# Patient Record
Sex: Male | Born: 1970 | Race: Black or African American | Hispanic: No | Smoking: Never smoker
Health system: Southern US, Community
[De-identification: ages and names within clinical notes are randomized; demographics above are authoritative.]

## PROBLEM LIST (undated history)

## (undated) HISTORY — PX: HERNIA REPAIR: SHX51

---

## 2011-03-22 ENCOUNTER — Ambulatory Visit: Payer: Self-pay | Admitting: Internal Medicine

## 2013-03-24 IMAGING — CR DG FOOT COMPLETE 3+V*L*
1 series · 3 of 3 positions shown · non-contrast
Comparison: none

REASON FOR EXAM: L foot pain due to injury at work.  Foot got caught
between and [employer] and forklif
COMMENTS:

PROCEDURE:     MDR - MDR FOOT LT COMP W/OBLQUES  - March 22, 2011  [DATE]
RESULT:     No fracture, dislocation or other acute bony abnormality is
identified. No soft tissue foreign body is noted.

[Series 1: view not recorded · 0.17mm/px · 3 of 3 slices shown]
[im 1/3]
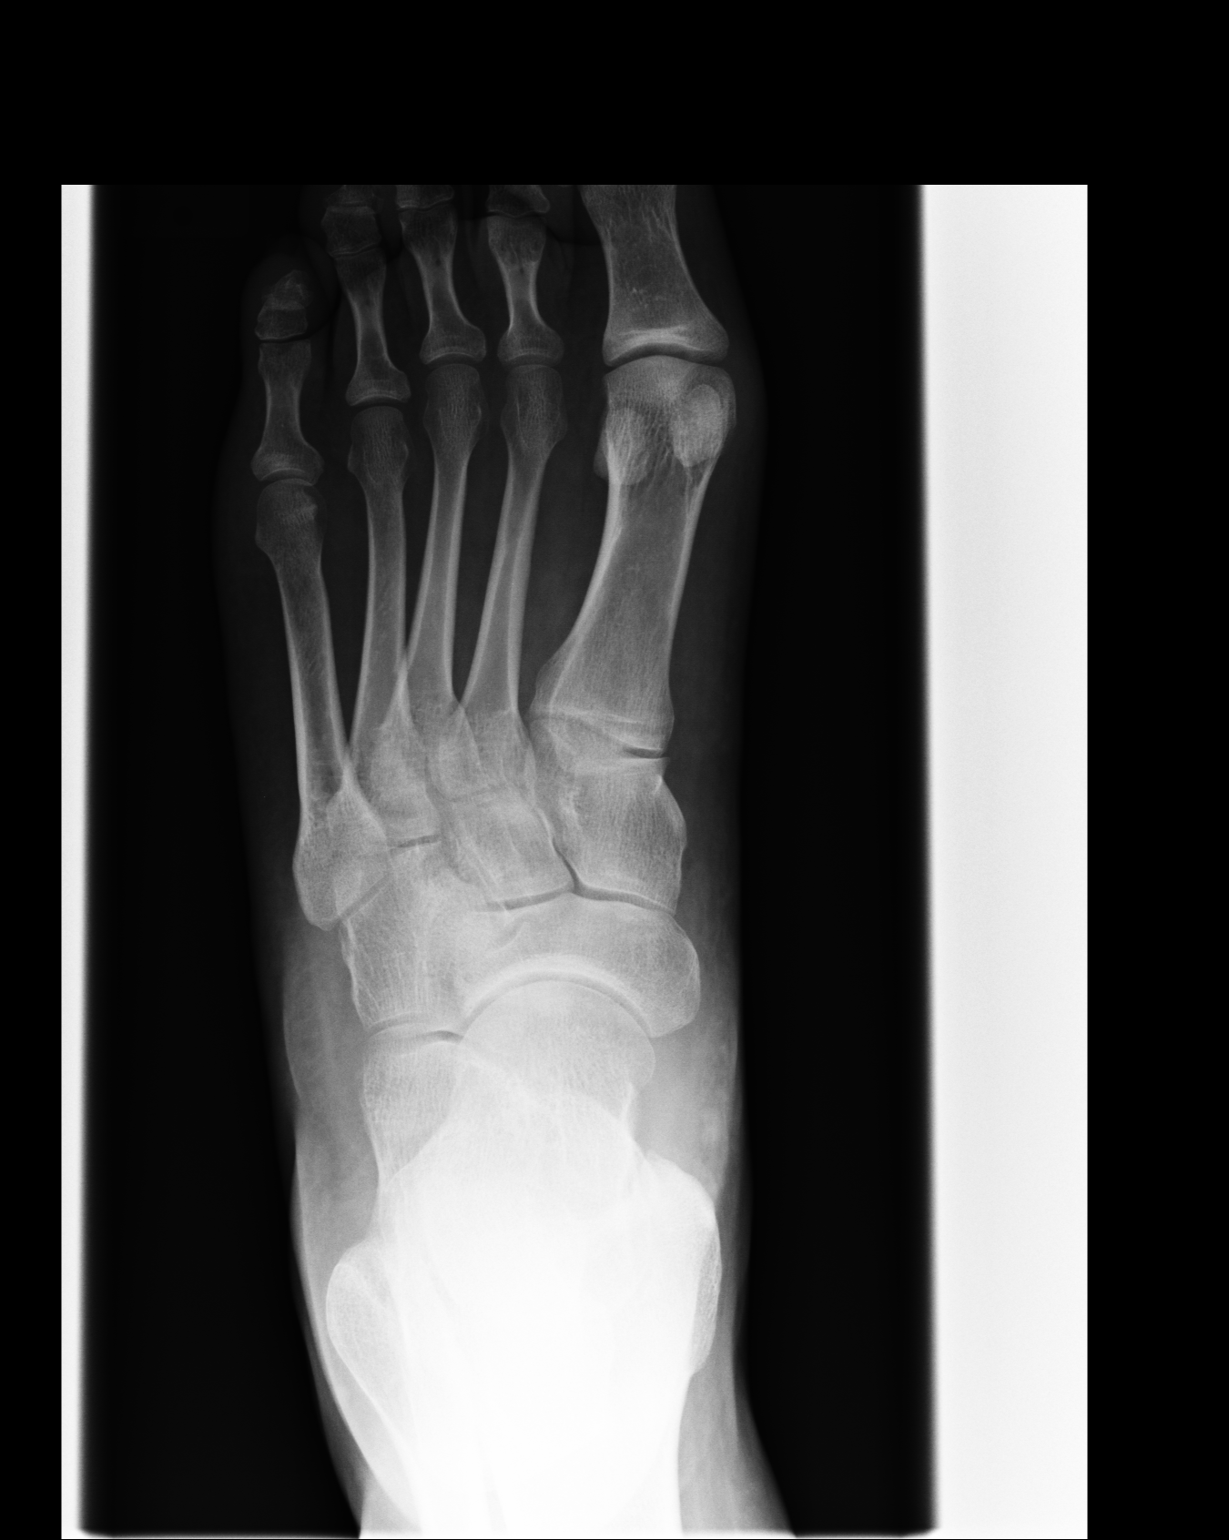
[im 2/3]
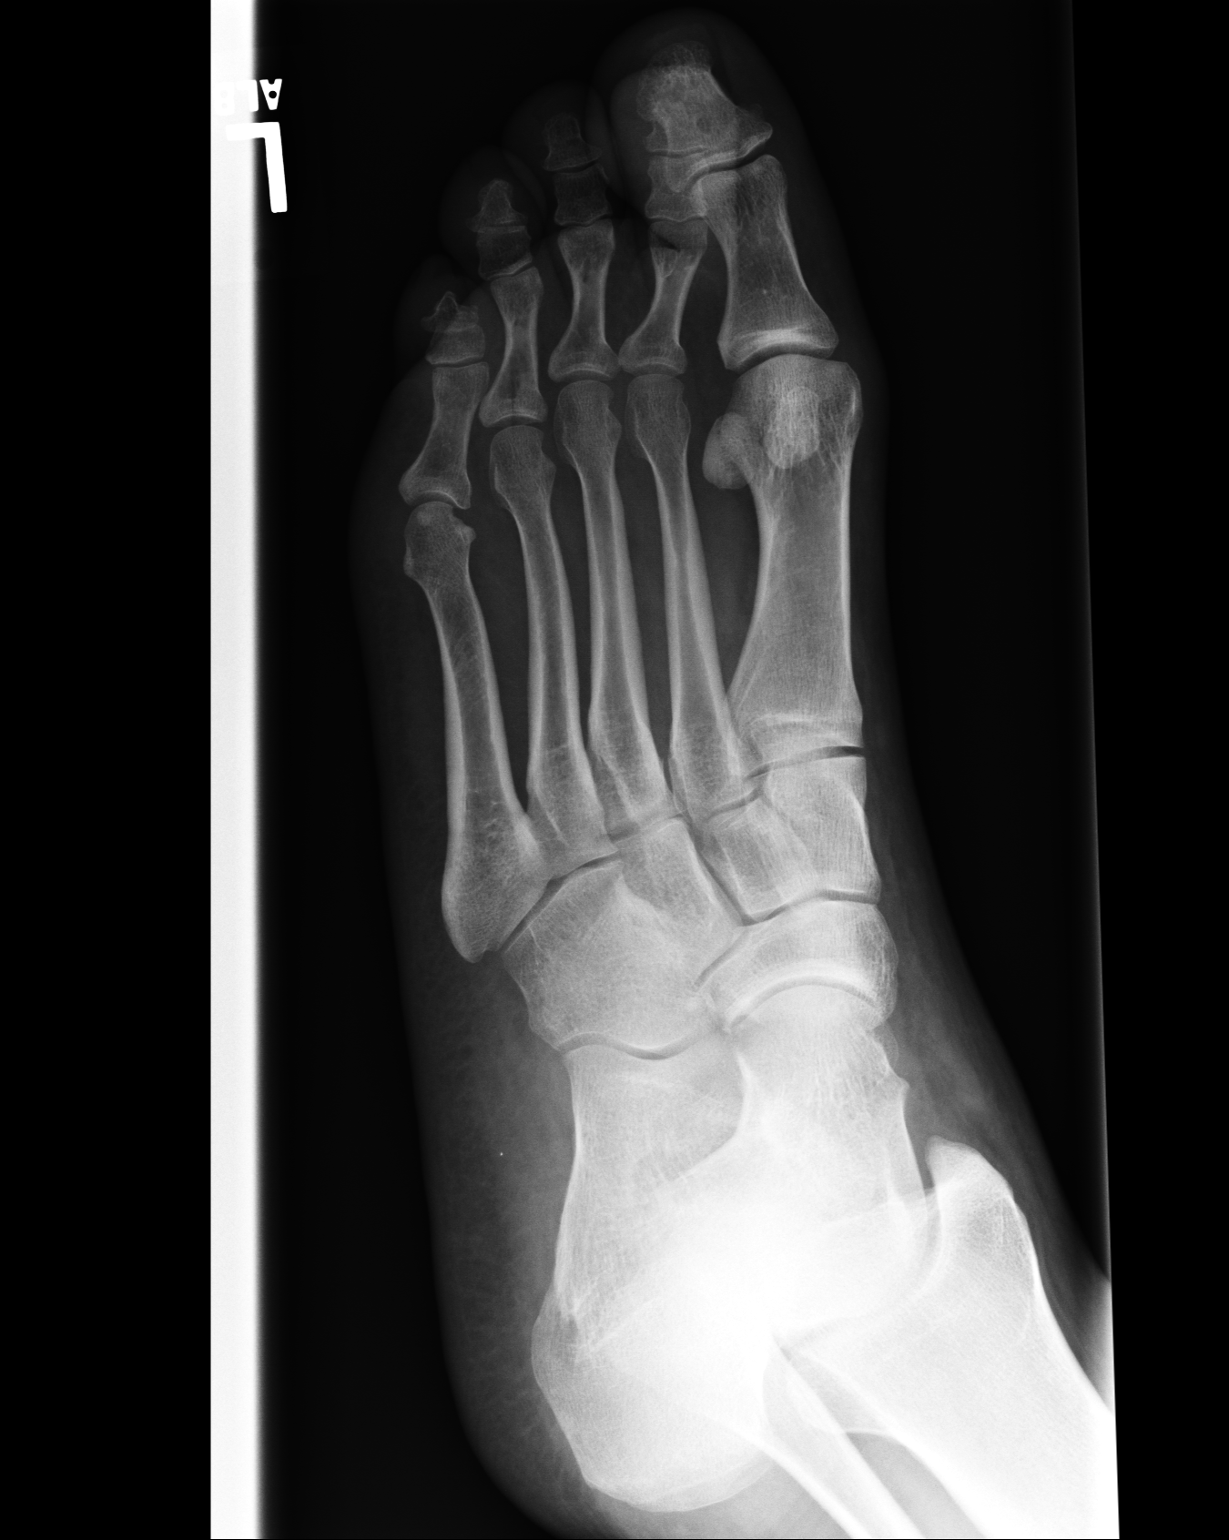
[im 3/3]
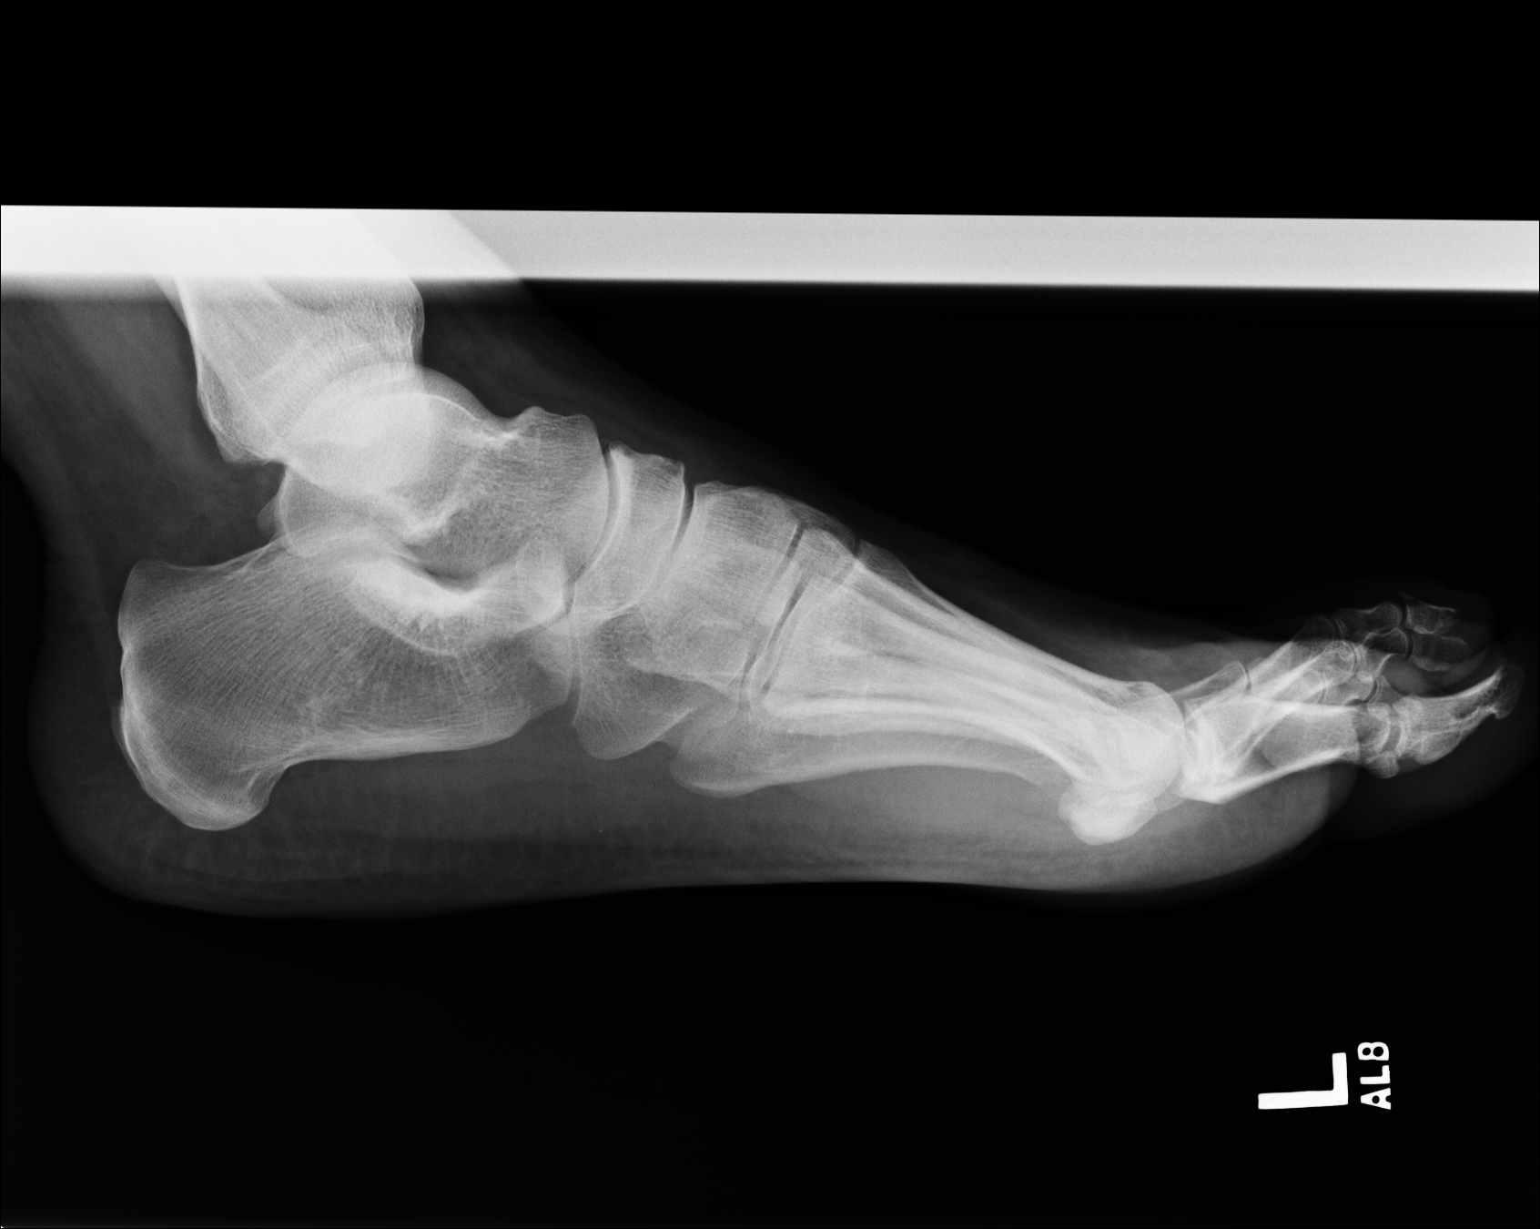

[3 of 3 positions shown; findings below may reference images not displayed]

IMPRESSION: No significant abnormalities are noted.

## 2020-07-07 ENCOUNTER — Other Ambulatory Visit: Payer: Self-pay | Admitting: Radiology

## 2020-07-07 ENCOUNTER — Other Ambulatory Visit: Payer: BLUE CROSS/BLUE SHIELD

## 2020-07-07 DIAGNOSIS — Z20822 Contact with and (suspected) exposure to covid-19: Secondary | ICD-10-CM

## 2020-07-09 LAB — SARS-COV-2, NAA 2 DAY TAT

## 2020-07-09 LAB — NOVEL CORONAVIRUS, NAA: SARS-CoV-2, NAA: DETECTED — AB

## 2022-05-02 ENCOUNTER — Telehealth: Payer: Self-pay

## 2022-05-02 NOTE — Telephone Encounter (Signed)
Received phone call from Shawn Stall, DIS regarding details for this pt: Pt had Reactive RPR with [facility] on 03/05/22.  No titer. Pt denied symptoms and states no prior hx of testing.  DIS requests: Syphilis screening and Request confirmatory lab regardless of RPR result.

## 2022-05-03 ENCOUNTER — Ambulatory Visit: Payer: Self-pay | Admitting: Nurse Practitioner

## 2022-05-03 DIAGNOSIS — Z113 Encounter for screening for infections with a predominantly sexual mode of transmission: Secondary | ICD-10-CM

## 2022-05-03 LAB — HM HIV SCREENING LAB: HM HIV Screening: NEGATIVE

## 2022-05-03 NOTE — Progress Notes (Signed)
[facility] Department STI clinic/screening visit  Subjective:  Bill Davis is a 51 y.o. male being seen today for an STI screening visit. The patient reports they do not have symptoms.    Patient has the following medical conditions:  There are no problems to display for this patient.    Chief Complaint  Patient presents with   SEXUALLY TRANSMITTED DISEASE    Screening    HPI  Patient reports to clinic today due to follow up from a positive RPR on 03/05/22 at the [facility].  Patient reports no signs and symptoms and desires additional STD screenings today.   Does the patient or their partner desires a pregnancy in the next year? No  Screening for MPX risk: Does the patient have an unexplained rash? No Is the patient MSM? No Does the patient endorse multiple sex partners or anonymous sex partners? No Did the patient have close or sexual contact with a person diagnosed with MPX? No Has the patient traveled outside the [place] where MPX is endemic? No Is there a high clinical suspicion for MPX-- evidenced by one of the following No  -Unlikely to be chickenpox  -Lymphadenopathy  -Rash that present in same phase of evolution on any given body part   See flowsheet for further details and programmatic requirements.    There is no immunization history on file for this patient.   The following portions of the patient's history were reviewed and updated as appropriate: allergies, current medications, past medical history, past social history, past surgical history and problem list.  Objective:  There were no vitals filed for this visit.  Physical Exam Constitutional:      Appearance: Normal appearance.  HENT:     Head: Normocephalic. No abrasion, masses or laceration. Hair is normal.     Mouth/Throat:     Lips: Pink.     Mouth: No oral lesions.     Dentition: No dental caries.     Pharynx: No pharyngeal swelling, oropharyngeal exudate, posterior  oropharyngeal erythema or uvula swelling.     Tonsils: No tonsillar exudate or tonsillar abscesses.  Eyes:     General: Lids are normal.        Right eye: No discharge.        Left eye: No discharge.     Conjunctiva/sclera: Conjunctivae normal.     Right eye: No exudate.    Left eye: No exudate. Abdominal:     General: Abdomen is flat.     Palpations: Abdomen is soft.     Tenderness: There is no abdominal tenderness. There is no rebound.  Genitourinary:    Pubic Area: No rash or pubic lice.      Penis: Normal and circumcised. No erythema or discharge.      Testes: Normal.        Right: Mass or tenderness not present.        Left: Mass or tenderness not present.     Rectum: Normal.     Comments: Discharge amount: none Color: None  Musculoskeletal:     Cervical back: Full passive range of motion without pain, normal range of motion and neck supple.  Lymphadenopathy:     Cervical: No cervical adenopathy.     Right cervical: No superficial, deep or posterior cervical adenopathy.    Left cervical: No superficial, deep or posterior cervical adenopathy.     Upper Body:     Right upper body: No supraclavicular, axillary or epitrochlear adenopathy.  Left upper body: No supraclavicular, axillary or epitrochlear adenopathy.     Lower Body: No right inguinal adenopathy. No left inguinal adenopathy.  Skin:    General: Skin is warm and dry.     Findings: No lesion or rash.  Neurological:     Mental Status: He is alert and oriented to person, place, and time.  Psychiatric:        Attention and Perception: Attention normal.        Mood and Affect: Mood normal.        Speech: Speech normal.        Behavior: Behavior normal. Behavior is cooperative.       Assessment and Plan:  Json ARGEL PABLO is a 51 y.o. male presenting to the [facility] Department for STI screening  1. Screening examination for venereal disease -51 year old male in clinic for positive RPR follow  up.  Patient sent to lab for confirmatory today.  Advised not to have sex until test results are received.  Will make recommendations based on test results. -Patient does not have STI symptoms Patient accepted all screenings including  oral GC, urine CT/GC and bloodwork for HIV/RPR.  Patient meets criteria for HepB screening? No. Ordered? No - low risk Patient meets criteria for HepC screening? No. Ordered? No - low risk  Recommended condom use with all sex Discussed importance of condom use for STI prevent   Discussed time line for State Lab results and that patient will be called with positive results and encouraged patient to call if he had not heard in 2 weeks Recommended returning for continued or worsening symptoms.    - HIV [place] [facility] - Syphilis Serology, [place] [facility] - Chlamydia/GC NAA, Confirmation - Gonococcus culture     Return if symptoms worsen or fail to improve.   Glenna Fellows, FNP

## 2022-05-05 LAB — CHLAMYDIA/GC NAA, CONFIRMATION: Chlamydia trachomatis, NAA: NEGATIVE

## 2022-05-11 NOTE — Telephone Encounter (Signed)
Spoke with Shawn Stall, DIS.  Discussed 1:128 titer from 05/03/22 blood test. Blima Singer will be trying to contact pt today, 05/11/22.

## 2022-05-12 ENCOUNTER — Telehealth: Payer: Self-pay

## 2022-05-12 NOTE — Telephone Encounter (Signed)
Telephone call to patient regarding Syphilis results and need for treatment. Verified patient password Informed patient of Positive Syphilis results and the need of treatment weekly for 3 weeks.  Patient verbalizes understanding and states he will call me back to schedule the appts after he can discuss his work schedule.

## 2022-05-18 NOTE — Telephone Encounter (Signed)
Phone call to [phone]. Left message that RN with [facility] is calling to follow-up, want to provide some help with getting appts scheduled.  Can call Macedonio Scallon at [phone] or Lorene Dy, the RN you spoke with before.

## 2022-05-18 NOTE — Telephone Encounter (Signed)
See phone calls to pt started on 05/12/22 re syphilis result and tx.

## 2022-05-23 NOTE — Telephone Encounter (Signed)
Phone call to [phone]. Left message that RN with [facility] is calling to follow-up, want to provide some help with getting treatment appts scheduled, need to schedule one day a week for 3 weeks in a row.  Call Awanda Wilcock at [phone].

## 2022-05-30 NOTE — Telephone Encounter (Signed)
Phone call with Shawn Stall, DIS.  Tiera left a letter at pt's home on 05/26/22.

## 2022-06-02 NOTE — Telephone Encounter (Signed)
Phone call to pt at [phone]. Left message that this is RN calling from [facility], it is very important that we get him scheduled for his treatments. Please call Nery Kalisz at [phone].

## 2022-06-07 NOTE — Telephone Encounter (Signed)
Phone call with Shawn Stall, DIS. Blima Singer has not heard anything else from pt either.

## 2022-06-07 NOTE — Progress Notes (Signed)
Mr Bill Davis was informed of positive Syphilis results and the need for treatment on 05/12/2022. Mr. Bill Davis hasn't called back to schedule appts for treatment.  Mr Bill Davis has been unable to reach by phone to schedule treatment visits.  First Notice letter mailed.

## 2022-06-08 NOTE — Telephone Encounter (Signed)
Stasia Cavalier, RN CD Supervisor, mailed certified letter to pt on 06/07/22.

## 2022-07-25 ENCOUNTER — Telehealth: Payer: Self-pay

## 2022-07-25 NOTE — Progress Notes (Signed)
Certified letter Returned to Sender, Unclaimed, Postal Carrier unable to forward.

## 2022-07-25 NOTE — Telephone Encounter (Signed)
Spoke with patient regarding need for syphilis treatment.  Patient states he had forgotten he needed this appt.  Appts scheduled [facility] #1 07/26/2022 [facility] #2 08/02/2022 [facility] #3 08/09/2022

## 2022-07-26 ENCOUNTER — Ambulatory Visit: Payer: Self-pay

## 2022-07-26 DIAGNOSIS — A539 Syphilis, unspecified: Secondary | ICD-10-CM

## 2022-07-26 MED ORDER — PENICILLIN G BENZATHINE 1200000 UNIT/2ML IM SUSY
2.4000 10*6.[IU] | PREFILLED_SYRINGE | Freq: Once | INTRAMUSCULAR | Status: AC
  Administered 2022-07-26: 2.4 10*6.[IU] via INTRAMUSCULAR

## 2022-07-26 NOTE — Progress Notes (Signed)
In nurse clinic for Bicillin #1, syphilis tx. NKA.  Bicillin 2.4 mu given IM per order by Onalee Hua, FNP dated 05/12/2022. Tolerated injections well. Advised to stay for 20 min observation.  Has remaining bicillin appts scheduled : 9/20 and 9/27. Pt aware. Jerel Shepherd, RN

## 2022-08-02 ENCOUNTER — Ambulatory Visit: Payer: Self-pay | Admitting: Family Medicine

## 2022-08-02 DIAGNOSIS — A539 Syphilis, unspecified: Secondary | ICD-10-CM

## 2022-08-02 MED ORDER — PENICILLIN G BENZATHINE 1200000 UNIT/2ML IM SUSY
2.4000 10*6.[IU] | PREFILLED_SYRINGE | INTRAMUSCULAR | Status: AC
  Administered 2022-08-02 – 2022-08-09 (×2): 2.4 10*6.[IU] via INTRAMUSCULAR

## 2022-08-02 NOTE — Progress Notes (Signed)
Pt here for #2 Bicillin injection.  2.4 MU given IM.  Pt tolerated well.  Pt monitored x 15 minutes.  Pt will return on 9/27 for #3 dose.  Windle Guard, RN

## 2022-08-09 ENCOUNTER — Ambulatory Visit: Payer: Self-pay

## 2022-08-09 DIAGNOSIS — A539 Syphilis, unspecified: Secondary | ICD-10-CM

## 2022-08-09 NOTE — Progress Notes (Signed)
In nurse clinic for Bicillin #3, syphilis tx. NKA.  Bicillin 2.4 mu given IM per order by Collene Leyden, FNP dated 05/12/2022. Tolerated injections well. Advised to stay for 20 min observation.  Appointment reminder card given to return for follow up testing in 6 month.   Patient with no questions.    Cassey Hurrell Shelda Pal, RN

## 2023-01-15 ENCOUNTER — Ambulatory Visit: Payer: Self-pay

## 2023-01-16 ENCOUNTER — Ambulatory Visit: Payer: Self-pay | Admitting: Family Medicine

## 2023-01-16 NOTE — Progress Notes (Signed)
Pt was not able to be seen today, due to a Provider not being available.  Pt rescheduled to 3/6 '@1'$ :20.  Windle Guard, RN

## 2023-01-17 ENCOUNTER — Ambulatory Visit: Payer: Self-pay | Admitting: Family

## 2023-01-17 ENCOUNTER — Encounter: Payer: Self-pay | Admitting: Family

## 2023-01-17 DIAGNOSIS — Z113 Encounter for screening for infections with a predominantly sexual mode of transmission: Secondary | ICD-10-CM

## 2023-01-17 DIAGNOSIS — Z202 Contact with and (suspected) exposure to infections with a predominantly sexual mode of transmission: Secondary | ICD-10-CM

## 2023-01-17 MED ORDER — METRONIDAZOLE 500 MG PO TABS: 500.0000 mg | ORAL_TABLET | Freq: Once | ORAL | 0 refills | Status: AC

## 2023-01-17 NOTE — Progress Notes (Signed)
[facility] Department STI clinic/screening visit  Subjective:  Uziah JAKOREY CULLETON is a 52 y.o. male being seen today for an STI screening visit. The patient reports they do not have symptoms.    Patient has the following medical conditions:  There are no problems to display for this patient.    Chief Complaint  Patient presents with   STI screen    Pt contact to trich    HPI  Patient reports informed that he was in contact with trichomoniasis. Last sex 12/27/2022. Denies any symptoms. Patient's job has been calling him and he needs to return to work, would like medication and declines physical examination and blood testing.   Last HIV test per patient/review of record was  Lab Results  Component Value Date   HMHIVSCREEN Negative - Validated 05/03/2022   No results found for: "HIV"  Does the patient or their partner desires a pregnancy in the next year? No  Screening for MPX risk: Does the patient have an unexplained rash? No Is the patient MSM? No Does the patient endorse multiple sex partners or anonymous sex partners? No Did the patient have close or sexual contact with a person diagnosed with MPX? No Has the patient traveled outside the [place] where MPX is endemic? No Is there a high clinical suspicion for MPX-- evidenced by one of the following No  -Unlikely to be chickenpox  -Lymphadenopathy  -Rash that present in same phase of evolution on any given body part   See flowsheet for further details and programmatic requirements.    There is no immunization history on file for this patient.   The following portions of the patient's history were reviewed and updated as appropriate: allergies, current medications, past medical history, past social history, past surgical history and problem list.  Objective:  There were no vitals filed for this visit.  Physical Exam Patient declines Physical Examination due to time constraints   Assessment and Plan:   Dashan W Forrister is a 52 y.o. male presenting to the [facility] Department for STI screening  1. Screening for venereal disease Will contact with positive results No blood collected for HIV/RPR- patient left before lab  - [facility] NAA, Confirmation  2. Exposure to venereal disease Metronidazole 2g PO as single dose ETOH precautions Abstain from sex for next 7 days, then use condoms for all sex thereafter     Patient does not have STI symptoms Patient accepted all screenings including  urine GC/Chlamydia, and blood work for HIV/Syphilis. Patient meets criteria for HepB screening? No. Ordered? no Patient meets criteria for HepC screening? No. Ordered? no Recommended condom use with all sex Discussed importance of condom use for STI prevent  Treat positive test results per standing order. Discussed time line for State Lab results and that patient will be called with positive results and encouraged patient to call if he had not heard in 2 weeks Recommended repeat testing in 3 months with positive results. Recommended returning for continued or worsening symptoms.   Return if symptoms worsen or fail to improve.  No future appointments.  Marline Backbone, FNP

## 2023-01-22 LAB — CHLAMYDIA/GC NAA, CONFIRMATION
Chlamydia trachomatis, NAA: NEGATIVE
Neisseria gonorrhoeae, NAA: NEGATIVE
